# Patient Record
Sex: Male | Born: 1980 | Race: White | Hispanic: No | Marital: Single | Smoking: Never smoker
Health system: Southern US, Community
[De-identification: ages and names within clinical notes are randomized; demographics above are authoritative.]

---

## 2015-06-17 ENCOUNTER — Emergency Department (HOSPITAL_COMMUNITY): Payer: Non-veteran care

## 2015-06-17 ENCOUNTER — Encounter (HOSPITAL_COMMUNITY): Payer: Self-pay | Admitting: *Deleted

## 2015-06-17 ENCOUNTER — Emergency Department (HOSPITAL_COMMUNITY)
Admission: EM | Admit: 2015-06-17 | Discharge: 2015-06-18 | Disposition: A | Discharge status: Home / Self Care | Payer: Non-veteran care | Attending: Emergency Medicine | Admitting: Emergency Medicine

## 2015-06-17 DIAGNOSIS — J020 Streptococcal pharyngitis: Secondary | ICD-10-CM | POA: Insufficient documentation

## 2015-06-17 DIAGNOSIS — R42 Dizziness and giddiness: Secondary | ICD-10-CM | POA: Insufficient documentation

## 2015-06-17 DIAGNOSIS — N508 Other specified disorders of male genital organs: Secondary | ICD-10-CM | POA: Insufficient documentation

## 2015-06-17 DIAGNOSIS — R51 Headache: Secondary | ICD-10-CM | POA: Diagnosis not present

## 2015-06-17 DIAGNOSIS — N50819 Testicular pain, unspecified: Secondary | ICD-10-CM

## 2015-06-17 DIAGNOSIS — R52 Pain, unspecified: Secondary | ICD-10-CM | POA: Diagnosis present

## 2015-06-17 MED ORDER — ACETAMINOPHEN 325 MG PO TABS
650.0000 mg | ORAL_TABLET | Freq: Once | ORAL | Status: DC
  Filled 2015-06-17: qty 2

## 2015-06-17 MED ORDER — IBUPROFEN 800 MG PO TABS
800.0000 mg | ORAL_TABLET | Freq: Once | ORAL | Status: AC
  Administered 2015-06-18: 800 mg via ORAL
  Filled 2015-06-17: qty 1

## 2015-06-17 MED ORDER — SODIUM CHLORIDE 0.9 % IV BOLUS (SEPSIS)
1000.0000 mL | Freq: Once | INTRAVENOUS | Status: AC
Start: 2015-06-17 — End: 2015-06-18
  Administered 2015-06-17: 1000 mL via INTRAVENOUS

## 2015-06-17 NOTE — ED Provider Notes (Signed)
CSN: [id number]     Arrival date & time 06/17/15  2147 History   First MD Initiated Contact with Patient 06/17/15 2316     Chief Complaint  Patient presents with  . Generalized Body Aches     (Consider location/radiation/quality/duration/timing/severity/associated sxs/prior Treatment) HPI Comments: Patient is a 34 yo M presenting to the ED for two days of chills, sore throat, generalized headaches, myalgias, arthralgias, lightheadedness, testicular pain, and decreased appetite. He has been taking Tylenol every six hours with little to no relief. Denies any sick contacts.   Patient is a 34 y.o. male presenting with URI and testicular pain. The history is provided by the patient.  URI Presenting symptoms: cough and sore throat   Presenting symptoms: no ear pain   Cough:    Cough characteristics:  Non-productive   Severity:  Mild   Timing:  Sporadic   Chronicity:  New Severity:  Moderate Duration:  2 days Associated symptoms: arthralgias, headaches and myalgias   Headaches:    Severity:  Mild   Duration:  2 days Risk factors: no chronic respiratory disease, no diabetes mellitus, no recent illness, no recent travel and no sick contacts   Testicle Pain This is a new problem. The current episode started yesterday. Associated symptoms include arthralgias, chills, coughing, headaches, myalgias, nausea and a sore throat. Pertinent negatives include no abdominal pain, chest pain, rash or vomiting.    History reviewed. No pertinent past medical history. History reviewed. No pertinent past surgical history. No family history on file. History  Substance Use Topics  . Smoking status: Never Smoker   . Smokeless tobacco: Not on file  . Alcohol Use: Yes    Review of Systems  Constitutional: Positive for chills and appetite change.  HENT: Positive for sore throat. Negative for ear pain.   Respiratory: Positive for cough.   Cardiovascular: Negative for chest pain.  Gastrointestinal:  Positive for nausea. Negative for vomiting and abdominal pain.  Genitourinary: Positive for testicular pain. Negative for dysuria, urgency, decreased urine volume, discharge, penile swelling, scrotal swelling and penile pain.  Musculoskeletal: Positive for myalgias and arthralgias.  Skin: Negative for rash.  Neurological: Positive for light-headedness and headaches.  All other systems reviewed and are negative.     Allergies  Review of patient's allergies indicates no known allergies.  Home Medications   Prior to Admission medications   Medication Sig Start Date End Date Taking? Authorizing Provider  acetaminophen (TYLENOL) 500 MG tablet Take 1,000 mg by mouth every 6 (six) hours as needed for mild pain or fever.   Yes Historical Provider, MD  ibuprofen (ADVIL,MOTRIN) 200 MG tablet Take 800 mg by mouth every 4 (four) hours as needed for moderate pain.   Yes Historical Provider, MD  Pseudoephedrine-APAP-DM (DAYQUIL PO) Take 2 capsules by mouth daily as needed (cold).   Yes Historical Provider, MD   BP 121/72 mmHg  Pulse 87  Temp(Src) 98.9 F (37.2 C) (Oral)  Resp 24  Ht  (1.778 m)  Wt 297 lb 4 oz (134.832 kg)  BMI 42.65 kg/m2  SpO2 96% Physical Exam  Constitutional: He is oriented to person, place, and time. He appears well-developed and well-nourished. No distress.  HENT:  Head: Normocephalic and atraumatic.  Right Ear: Hearing, tympanic membrane, external ear and ear canal normal.  Left Ear: Hearing, tympanic membrane, external ear and ear canal normal.  Nose: Nose normal.  Mouth/Throat: Uvula is midline. Mucous membranes are dry. No trismus in the jaw. Posterior oropharyngeal erythema present.  No oropharyngeal exudate, posterior oropharyngeal edema or tonsillar abscesses.  Eyes: Conjunctivae are normal.  Neck: Normal range of motion. Neck supple.  No nuchal rigidity.   Cardiovascular: Normal rate.   Pulmonary/Chest: Effort normal.  Abdominal: Soft. There is no  tenderness.  Genitourinary: Penis normal. Right testis shows tenderness. Right testis shows no mass and no swelling. Left testis shows tenderness. Left testis shows no mass and no swelling.  Musculoskeletal: Normal range of motion.  Lymphadenopathy:       Right: No inguinal adenopathy present.       Left: No inguinal adenopathy present.  Neurological: He is alert and oriented to person, place, and time.  Skin: Skin is warm and dry. No rash noted. He is not diaphoretic.  Psychiatric: He has a normal mood and affect.  Nursing note and vitals reviewed.   ED Course  Procedures (including critical care time) Medications  acetaminophen (TYLENOL) tablet 650 mg (not administered)  ibuprofen (ADVIL,MOTRIN) tablet 800 mg (not administered)  penicillin g benzathine (BICILLIN LA) 1200000 UNIT/2ML injection 1.2 Million Units (not administered)  dexamethasone (DECADRON) 1 MG/ML solution 10 mg (not administered)  sodium chloride 0.9 % bolus 1,000 mL (1,000 mLs Intravenous New Bag/Given 06/17/15 2357)    Labs Review Labs Reviewed  RAPID STREP SCREEN (NOT AT [facility]) - Abnormal; Notable for the following:    Streptococcus, Group A Screen (Direct) POSITIVE (*)    All other components within normal limits  CBC - Abnormal; Notable for the following:    WBC 11.5 (*)    All other components within normal limits  I-STAT CHEM 8, ED - Abnormal; Notable for the following:    Chloride 98 (*)    Glucose, Bld 103 (*)    Hemoglobin 17.3 (*)    All other components within normal limits  URINE CULTURE  URINALYSIS, ROUTINE W REFLEX MICROSCOPIC (NOT AT [facility])    Imaging Review No results found.   EKG Interpretation None      MDM   Final diagnoses:  Testicle pain  Strep pharyngitis    Filed Vitals:   06/17/15 2330  BP: 121/72  Pulse: 87  Temp:   Resp:    Afebrile, NAD, non-toxic appearing, AAOx4.     I have reviewed nursing notes, vital signs, and all appropriate lab and imaging results if  ordered as above.   US, CXR, and UA pending at shift change. Will treat with IM Bicllin and decadron for strep. Signed out to Earley FavorGail Schulz, NP pending imaging results.   Patient d/w with Dr. Blinda LeatherwoodPollina, agrees with plan.    Francee PiccoloJennifer Shann Lewellyn, PA-C 06/18/15 [id number]  Gilda Creasehristopher J Pollina, MD 06/18/15 [phone]

## 2015-06-17 NOTE — ED Notes (Signed)
The pt is c/o total body aches for 2 days  Headache sore throat chills eyes hurt sometimes dizzy

## 2015-06-18 ENCOUNTER — Emergency Department (HOSPITAL_COMMUNITY): Payer: Non-veteran care

## 2015-06-18 LAB — CBC
HEMATOCRIT: 44.4 % (ref 39.0–52.0)
Hemoglobin: 15.3 g/dL (ref 13.0–17.0)
MCH: 29.9 pg (ref 26.0–34.0)
MCHC: 34.5 g/dL (ref 30.0–36.0)
MCV: 86.7 fL (ref 78.0–100.0)
PLATELETS: 243 10*3/uL (ref 150–400)
RBC: 5.12 MIL/uL (ref 4.22–5.81)
RDW: 12.8 % (ref 11.5–15.5)
WBC: 11.5 10*3/uL — AB (ref 4.0–10.5)

## 2015-06-18 LAB — URINALYSIS, ROUTINE W REFLEX MICROSCOPIC
BILIRUBIN URINE: NEGATIVE
Glucose, UA: NEGATIVE mg/dL
HGB URINE DIPSTICK: NEGATIVE
Ketones, ur: NEGATIVE mg/dL
Leukocytes, UA: NEGATIVE
Nitrite: NEGATIVE
PH: 6.5 (ref 5.0–8.0)
Protein, ur: NEGATIVE mg/dL
SPECIFIC GRAVITY, URINE: 1.008 (ref 1.005–1.030)
Urobilinogen, UA: 0.2 mg/dL (ref 0.0–1.0)

## 2015-06-18 LAB — I-STAT CHEM 8, ED
BUN: 11 mg/dL (ref 6–20)
Calcium, Ion: 1.17 mmol/L (ref 1.12–1.23)
Chloride: 98 mmol/L — ABNORMAL LOW (ref 101–111)
Creatinine, Ser: 1.1 mg/dL (ref 0.61–1.24)
Glucose, Bld: 103 mg/dL — ABNORMAL HIGH (ref 65–99)
HEMATOCRIT: 51 % (ref 39.0–52.0)
HEMOGLOBIN: 17.3 g/dL — AB (ref 13.0–17.0)
POTASSIUM: 4.4 mmol/L (ref 3.5–5.1)
SODIUM: 137 mmol/L (ref 135–145)
TCO2: 24 mmol/L (ref 0–100)

## 2015-06-18 LAB — RAPID STREP SCREEN (MED CTR MEBANE ONLY): STREPTOCOCCUS, GROUP A SCREEN (DIRECT): POSITIVE — AB

## 2015-06-18 MED ORDER — PENICILLIN G BENZATHINE 1200000 UNIT/2ML IM SUSP
1.2000 10*6.[IU] | Freq: Once | INTRAMUSCULAR | Status: AC
  Administered 2015-06-18: 1.2 10*6.[IU] via INTRAMUSCULAR
  Filled 2015-06-18: qty 2

## 2015-06-18 MED ORDER — DEXAMETHASONE 1 MG/ML PO CONC
10.0000 mg | Freq: Once | ORAL | Status: AC
  Administered 2015-06-18: 10 mg via ORAL
  Filled 2015-06-18: qty 10

## 2015-06-18 NOTE — ED Provider Notes (Signed)
Ultrasound reviewed all within normal parameters.  He was treated by the previous PA, with IM Bicillin for his positive strep test.  He's been instructed that he can take Tylenol or ibuprofen for discomfort for the next several dayspolina  Earley FavorGail Erikson Danzy, NP 06/18/15 0127  Gilda Creasehristopher J Pollina, MD 06/19/15 [id number]

## 2015-06-18 NOTE — ED Notes (Signed)
Patient transported to Ultrasound 

## 2015-06-18 NOTE — Discharge Instructions (Signed)
You were treated with IM and a bionics for your strep throat.  Ultrasound is normal, you can take Tylenol or ibuprofen for discomfort for the next several days

## 2015-06-19 LAB — URINE CULTURE: CULTURE: NO GROWTH

## 2015-09-26 IMAGING — US US SCROTUM
1 series · 14 of 25 positions shown · non-contrast
Comparison: None.

CLINICAL DATA: Bilateral testicular pain for 2 days, generalized
body ache and myalgias.

EXAM:
SCROTAL ULTRASOUND
DOPPLER ULTRASOUND OF THE TESTICLES
TECHNIQUE: Complete ultrasound examination of the testicles, epididymis, and
other scrotal structures was performed. Color and spectral Doppler
ultrasound were also utilized to evaluate blood flow to the
testicles.

[Series 1: us scrotum · 0.06mm/px · 14 of 29 slices shown]
[im 1/29]
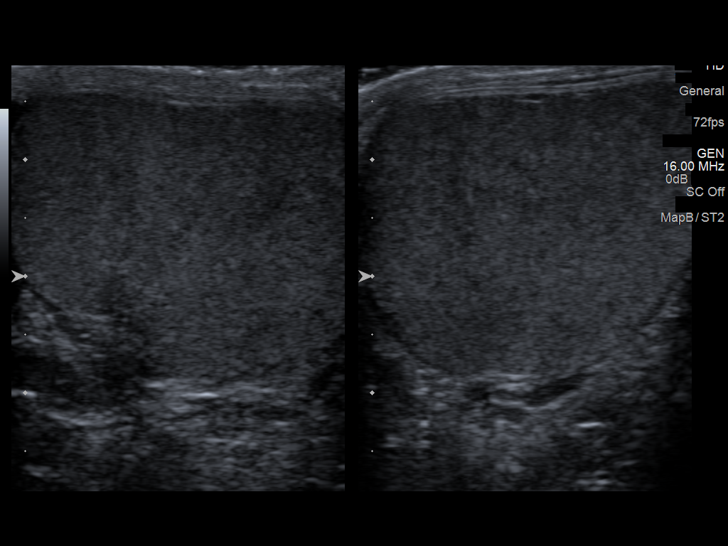
[im 3/29]
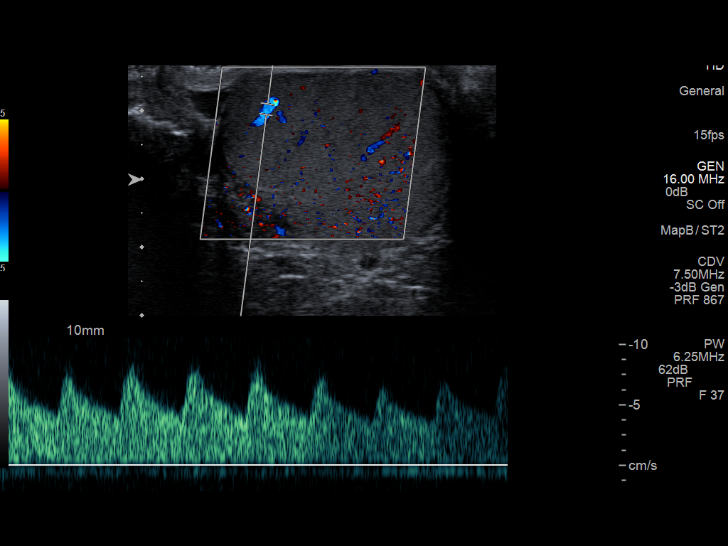
[im 5/29]
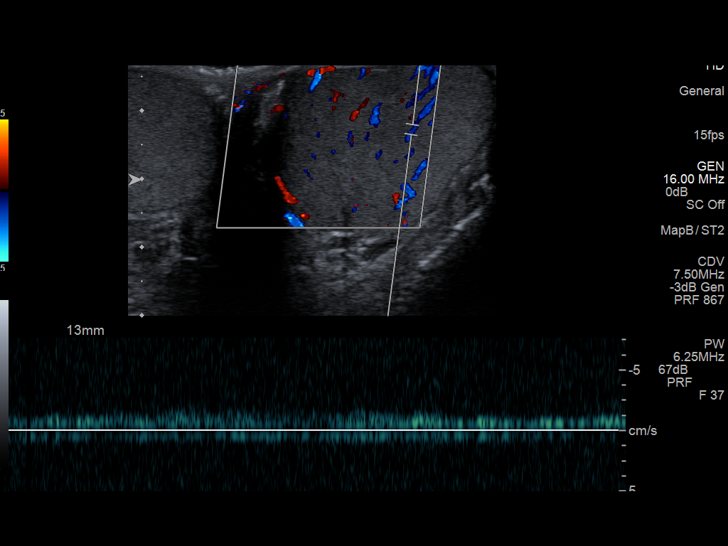
[im 8/29]
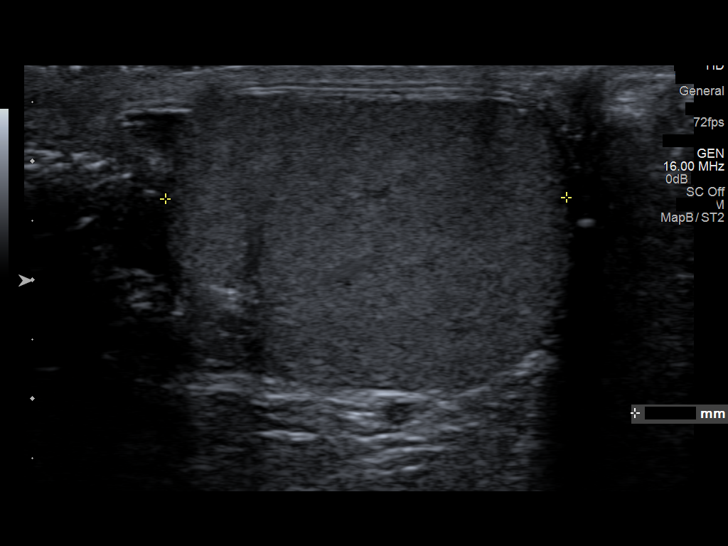
[im 10/29]
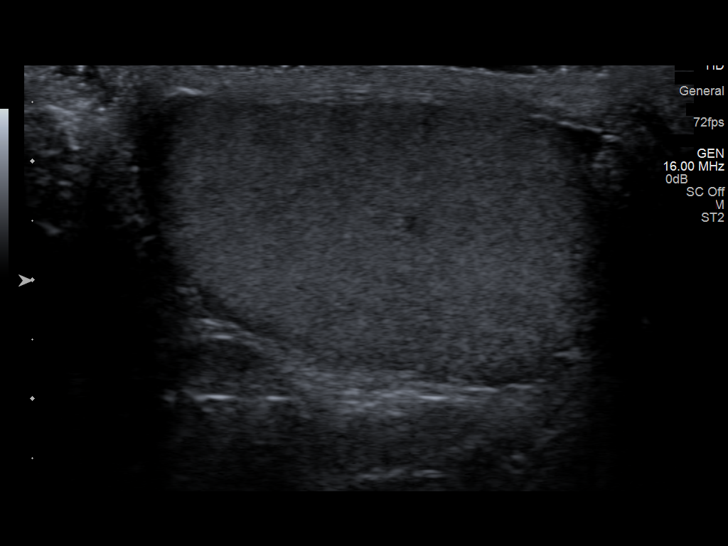
[im 11/29]
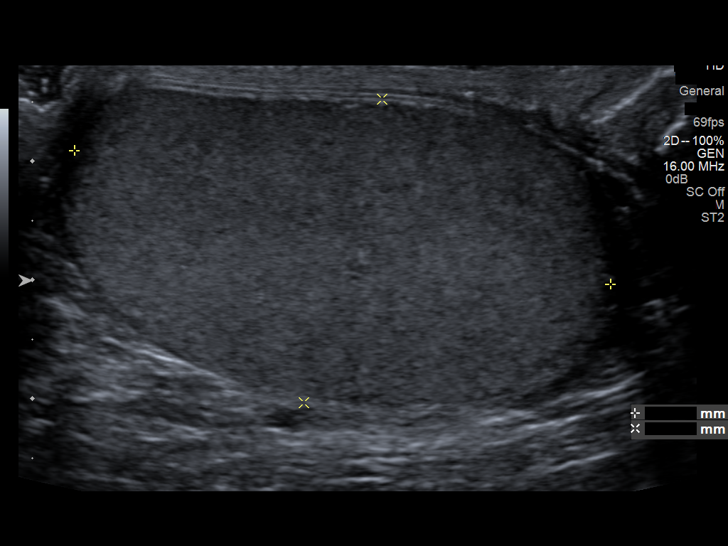
[im 13/29]
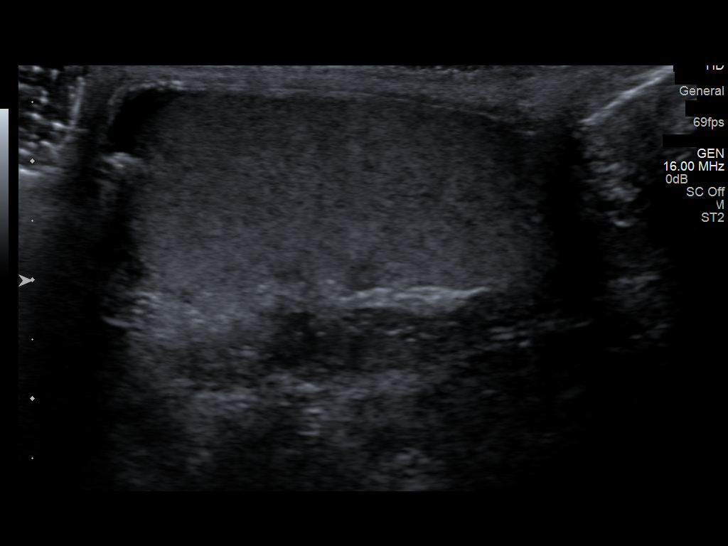
[im 16/29]
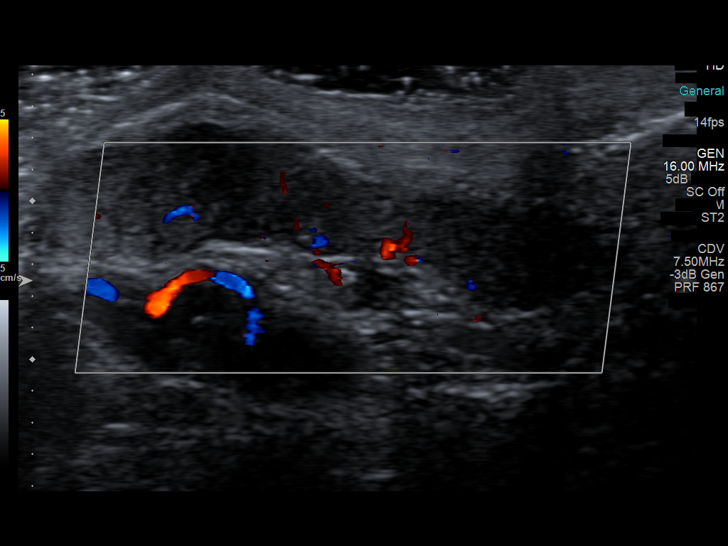
[im 18/29]
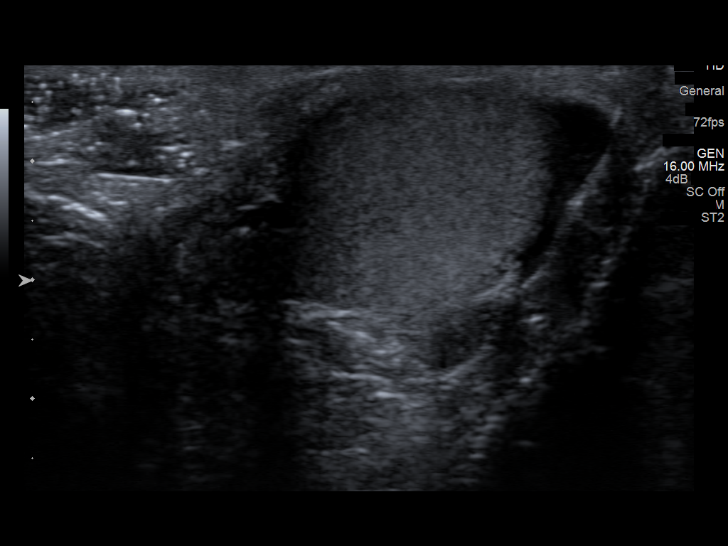
[im 19/29]
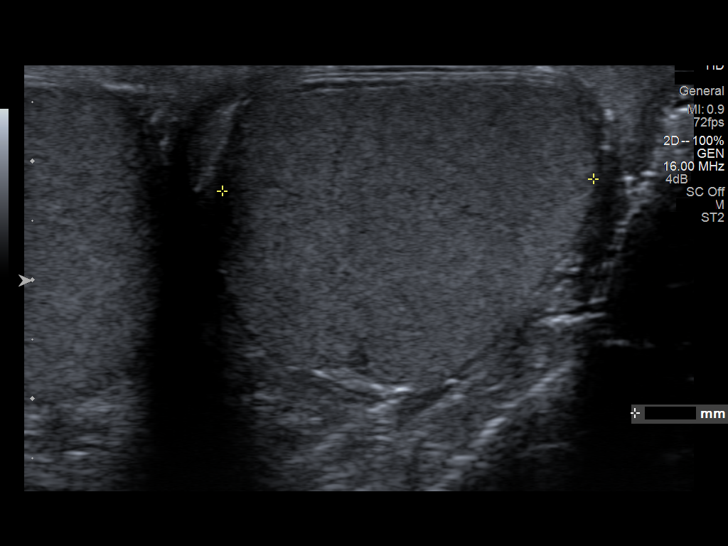
[im 22/29]
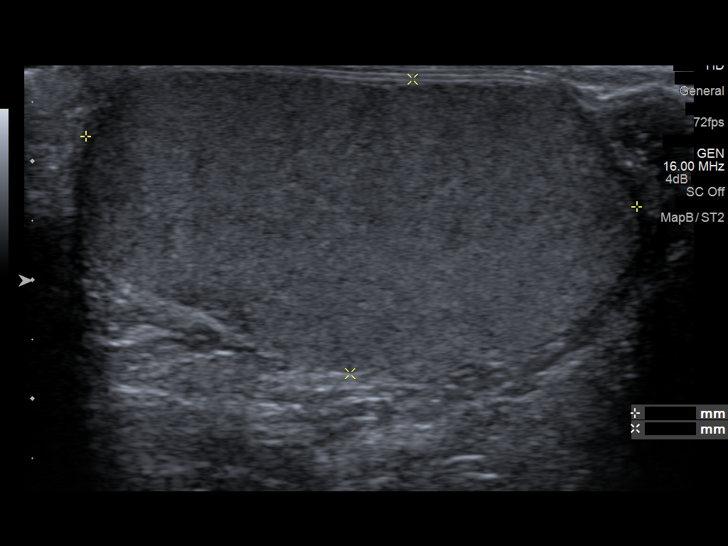
[im 24/29]
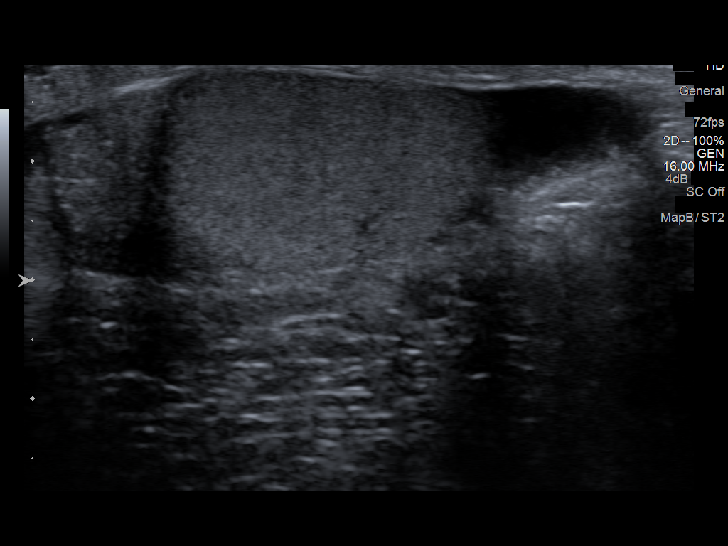
[im 26/29]
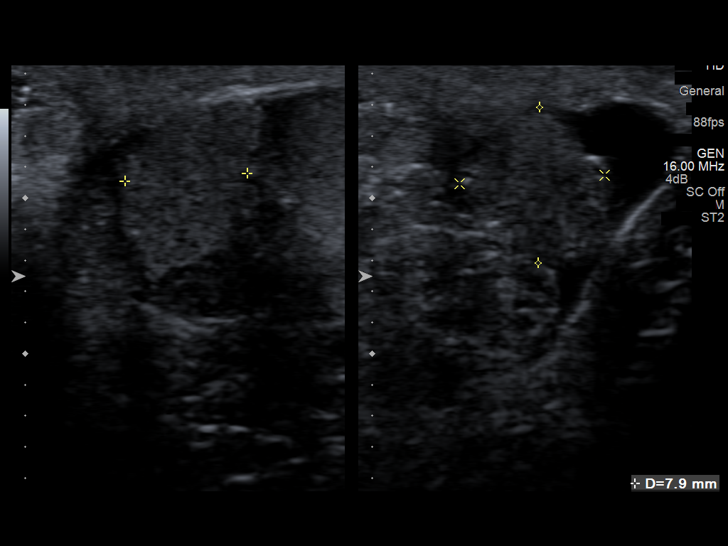
[im 29/29]
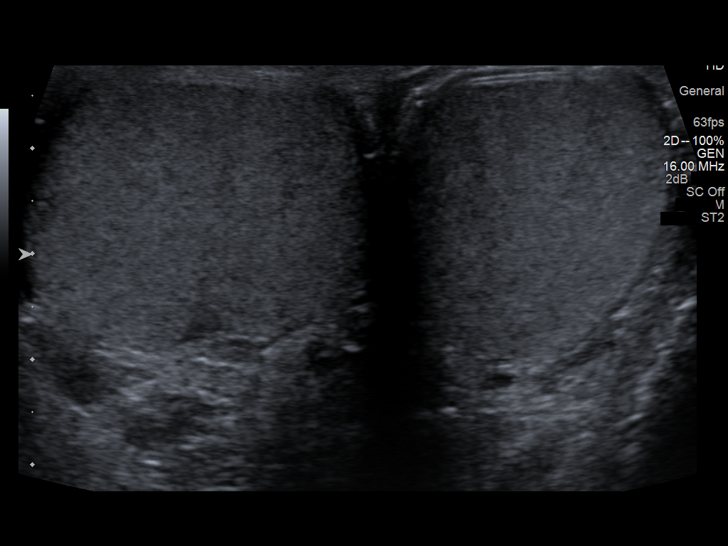

[14 of 25 positions shown; findings below may reference images not displayed]

FINDINGS: Right testicle

Measurements: 4.7 x 2.6 x 3.4 cm. No mass or microlithiasis
visualized.

Left testicle

Measurements: 4.7 x 2.5 x 3.1 cm. No mass or microlithiasis
visualized.

Right epididymis:  Normal in size and appearance.

Left epididymis:  Normal in size and appearance.

Hydrocele:  None visualized.

Varicocele:  None visualized.

Pulsed Doppler interrogation of both testes demonstrates normal low
resistance arterial and venous waveforms bilaterally.
IMPRESSION: Normal scrotal sonogram.  Normal Doppler sonogram of the testes.

## 2016-05-03 IMAGING — CR DG CHEST 2V
2 series · 2 of 2 positions shown · non-contrast
Comparison: None.

CLINICAL DATA: Fever today.

EXAM:
CHEST  2 VIEW

[chest pa]
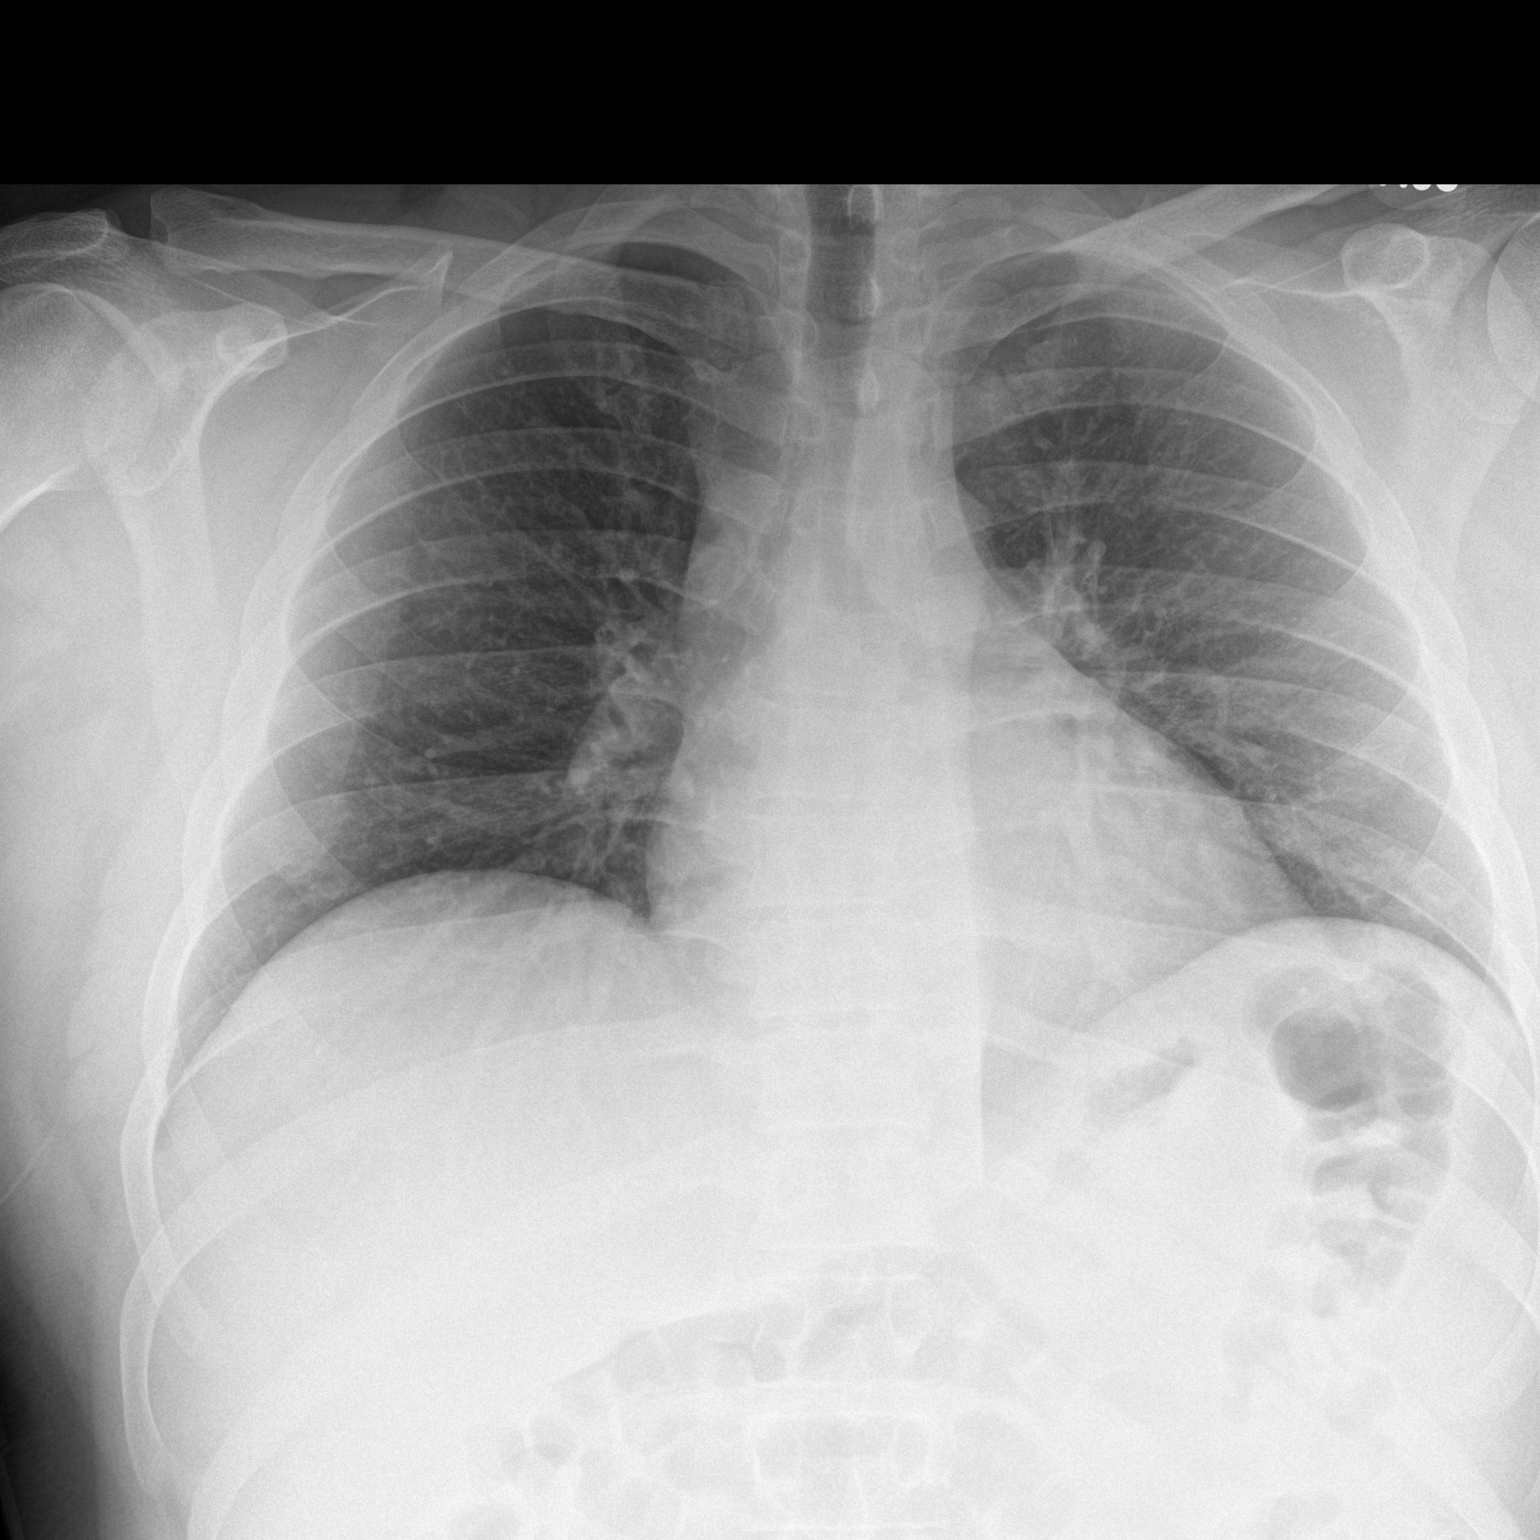

[chest lat]
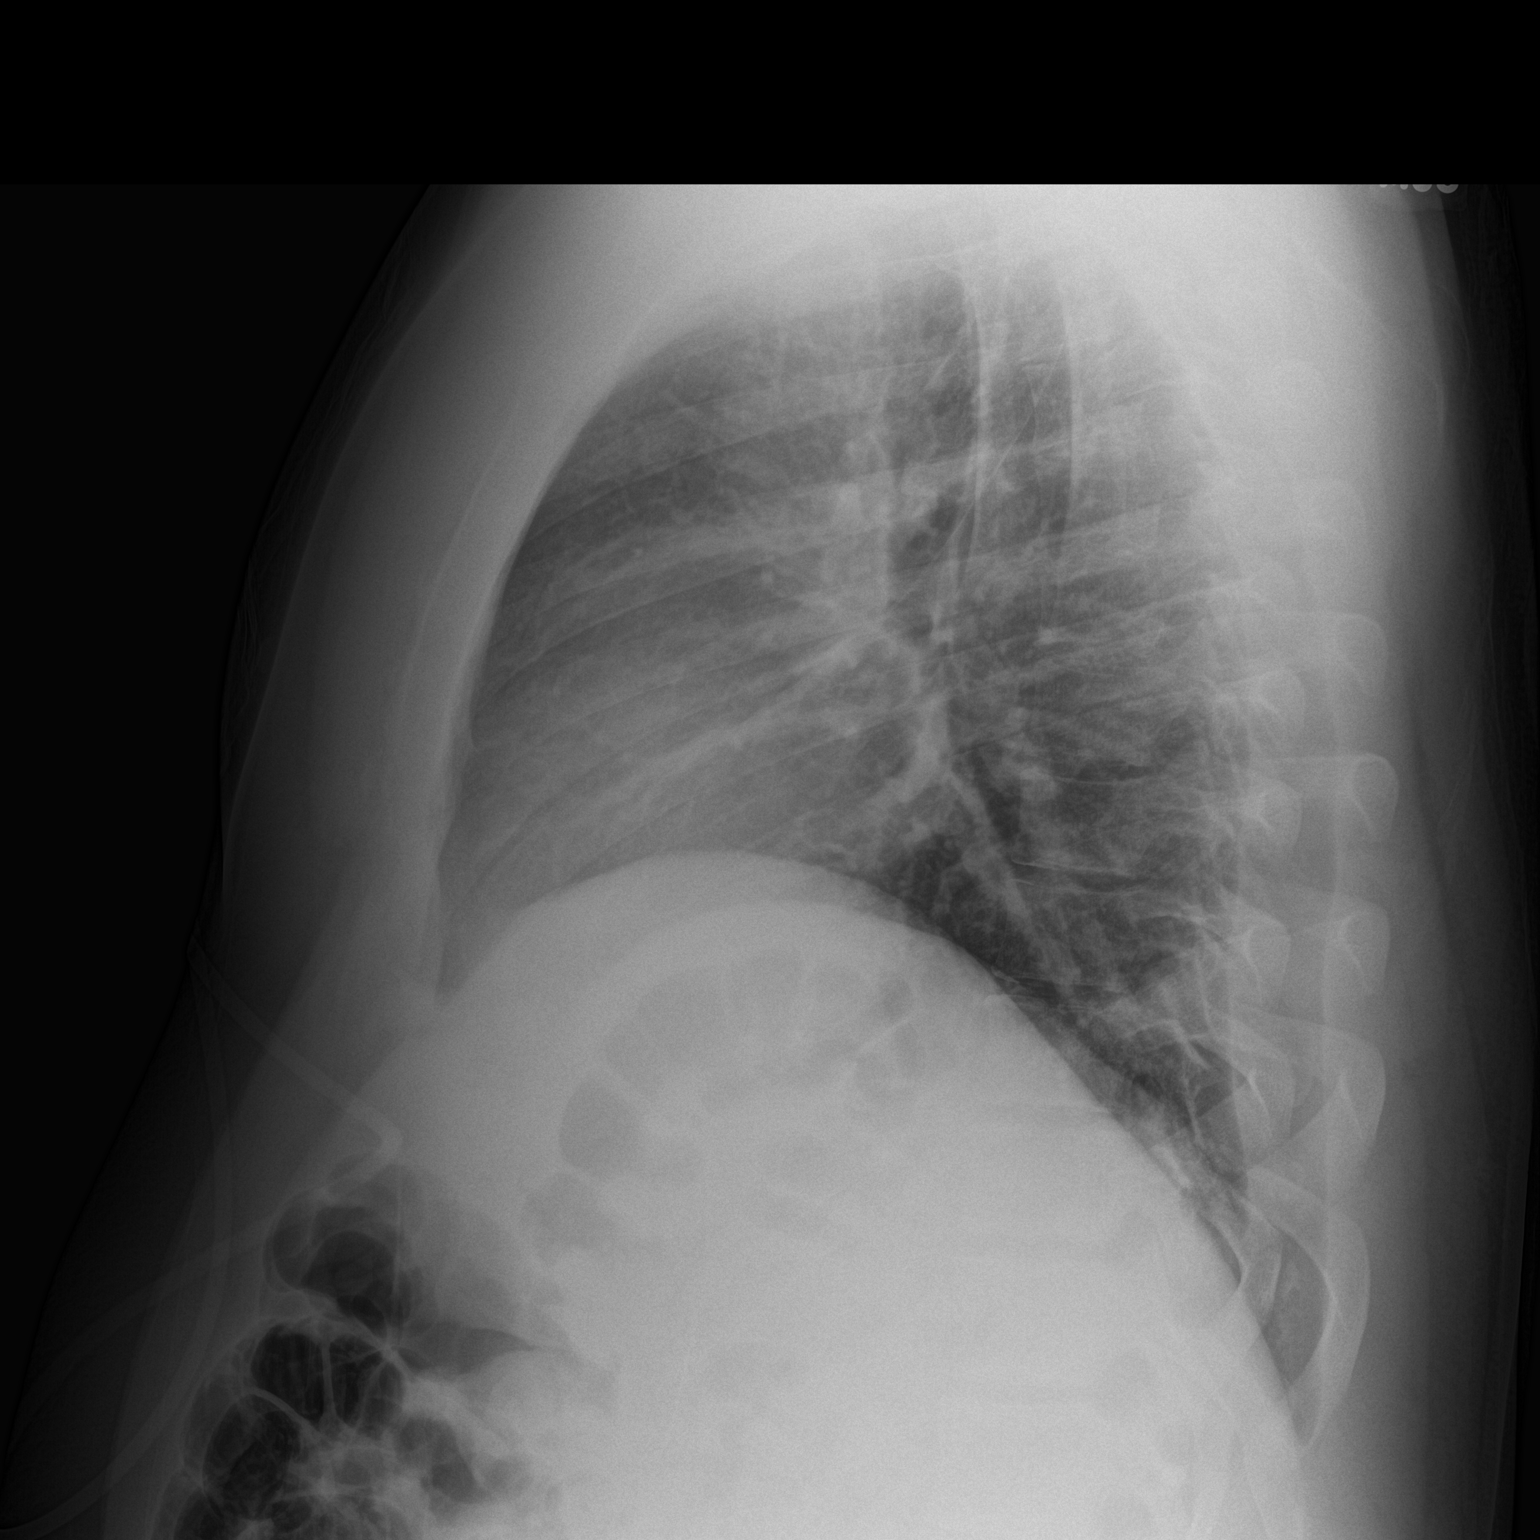

[2 of 2 positions shown; findings below may reference images not displayed]

FINDINGS: Cardiomediastinal silhouette is unremarkable for this low
inspiratory examination with crowded vasculature markings. The lungs
are clear without pleural effusions or focal consolidations. Trachea
projects midline and there is no pneumothorax. Included soft tissue
planes and osseous structures are non-suspicious.
IMPRESSION: No acute cardiopulmonary process for this low inspiratory
examination.
